# Patient Record
Sex: Male | Born: 1955 | Race: White | Hispanic: No | Marital: Married | Smoking: Former smoker
Health system: Southern US, Community
[De-identification: ages and names within clinical notes are randomized; demographics above are authoritative.]

## PROBLEM LIST (undated history)

## (undated) DIAGNOSIS — I2510 Atherosclerotic heart disease of native coronary artery without angina pectoris: Secondary | ICD-10-CM

## (undated) DIAGNOSIS — I10 Essential (primary) hypertension: Secondary | ICD-10-CM

## (undated) HISTORY — PX: CARDIAC SURGERY: SHX584

---

## 2018-08-09 ENCOUNTER — Other Ambulatory Visit: Payer: Self-pay

## 2018-08-09 ENCOUNTER — Emergency Department
Admission: EM | Admit: 2018-08-09 | Discharge: 2018-08-09 | Disposition: A | Discharge status: Home / Self Care | Payer: BC Managed Care – PPO | Attending: Emergency Medicine | Admitting: Emergency Medicine

## 2018-08-09 ENCOUNTER — Emergency Department: Payer: BC Managed Care – PPO

## 2018-08-09 DIAGNOSIS — Y93E9 Activity, other interior property and clothing maintenance: Secondary | ICD-10-CM | POA: Insufficient documentation

## 2018-08-09 DIAGNOSIS — Z87891 Personal history of nicotine dependence: Secondary | ICD-10-CM | POA: Insufficient documentation

## 2018-08-09 DIAGNOSIS — I2510 Atherosclerotic heart disease of native coronary artery without angina pectoris: Secondary | ICD-10-CM | POA: Diagnosis not present

## 2018-08-09 DIAGNOSIS — Y999 Unspecified external cause status: Secondary | ICD-10-CM | POA: Insufficient documentation

## 2018-08-09 DIAGNOSIS — W1789XA Other fall from one level to another, initial encounter: Secondary | ICD-10-CM | POA: Insufficient documentation

## 2018-08-09 DIAGNOSIS — S3992XA Unspecified injury of lower back, initial encounter: Secondary | ICD-10-CM | POA: Diagnosis present

## 2018-08-09 DIAGNOSIS — Z79899 Other long term (current) drug therapy: Secondary | ICD-10-CM | POA: Diagnosis not present

## 2018-08-09 DIAGNOSIS — Y929 Unspecified place or not applicable: Secondary | ICD-10-CM | POA: Insufficient documentation

## 2018-08-09 DIAGNOSIS — S300XXA Contusion of lower back and pelvis, initial encounter: Secondary | ICD-10-CM | POA: Diagnosis not present

## 2018-08-09 DIAGNOSIS — I10 Essential (primary) hypertension: Secondary | ICD-10-CM | POA: Insufficient documentation

## 2018-08-09 HISTORY — DX: Essential (primary) hypertension: I10

## 2018-08-09 HISTORY — DX: Atherosclerotic heart disease of native coronary artery without angina pectoris: I25.10

## 2018-08-09 NOTE — ED Triage Notes (Signed)
Pt states today he was hanging some curtains and slipped off the stool fall back onto his tail bone. Pt c/o pain and bruising to the area

## 2018-08-09 NOTE — ED Provider Notes (Signed)
[facility] Emergency Department Provider Note ____________________________________________  Time seen: Approximately 11:54 AM  I have reviewed the triage vital signs and the nursing notes.   HISTORY  Chief Complaint Fall    HPI Caleb Carr is a 63 y.o. male who presents to the emergency department for evaluation and treatment of right side back pain.  While hanging some curtains this morning, he started stepping backwards off of the footstool and missed the step.  He landed on a metal mattress casing.  Since that time, he has had a large swollen area to right lower back.  No alleviating measures attempted prior to arrival. Past Medical History:  Diagnosis Date  . Coronary artery disease   . Hypertension     There are no active problems to display for this patient.   Prior to Admission medications   Medication Sig Start Date End Date Taking? Authorizing Provider  chlorthalidone (HYGROTON) 25 MG tablet Take 25 mg by mouth daily.   Yes [provider]  irbesartan (AVAPRO) 150 MG tablet Take 150 mg by mouth daily.   Yes [provider]  VERAPAMIL HCL PO Take 240 mg by mouth.   Yes [provider]    Allergies Sulfa antibiotics  No family history on file.  Social History Social History   Tobacco Use  . Smoking status: Former Games developermoker  . Smokeless tobacco: Never Used  Substance Use Topics  . Alcohol use: Yes  . Drug use: Not Currently    Review of Systems Constitutional: Negative for fever. Cardiovascular: Negative for chest pain. Respiratory: Negative for shortness of breath. Musculoskeletal: Positive for right lower back pain Skin: Positive for swelling in the right lower back Neurological: Negative for decrease in sensation  ____________________________________________   PHYSICAL EXAM:  VITAL SIGNS: ED Triage Vitals  Enc Vitals Group     BP 08/09/18 1026 (!) 143/89     Pulse Rate 08/09/18 1026 71     Resp  08/09/18 1026 16     Temp 08/09/18 1028 98.1 F (36.7 C)     Temp Source 08/09/18 1026 Oral     SpO2 08/09/18 1026 98 %     Weight 08/09/18 1023 190 lb (86.2 kg)     Height 08/09/18 1023 5\' 8"  (1.727 m)     Head Circumference --      Peak Flow --      Pain Score 08/09/18 1023 8     Pain Loc --      Pain Edu? --      Excl. in GC? --     Constitutional: Alert and oriented. Well appearing and in no acute distress. Eyes: Conjunctivae are clear without discharge or drainage Head: Atraumatic Neck: Supple Respiratory: No cough. Respirations are even and unlabored. Musculoskeletal: No focal bony tenderness over the lumbar spine.  Swelling prevents palpation over the sacrum.  Swelling does not extend to the coccyx area. Neurologic: Ambulating without assistance.  Awake, alert, oriented x4. Skin: Firm, edematous area over the right side sacrum without open wounds, contusions, or ecchymosis Psychiatric: Affect and behavior are appropriate.  ____________________________________________   LABS (all labs ordered are listed, but only abnormal results are displayed)  Labs Reviewed - No data to display ____________________________________________  RADIOLOGY  Image of the sacrum and coccyx shows no acute bony abnormality per radiology. ____________________________________________   PROCEDURES  Procedures  ____________________________________________   INITIAL IMPRESSION / ASSESSMENT AND PLAN / ED COURSE  Caleb RaringKevin Prowell is a 63 y.o. who presents  to the emergency department for treatment and evaluation after mechanical, non-syncopal fall where he struck his back on the metal frame of the bed.  He states that he applied some ice to the area, but the area kept swelling and has significant other was worried about a "blood clot."  X-ray of the sacrum and coccyx is reassuring.  No fractures noted.  Patient is ambulatory in the room and is able to bend and turn without complaint of significant  pain.  He will be discharged home and advised to continue with the ice and ibuprofen.  Patient states that he is visiting this area for the next 3 weeks.  He was advised to return to the emergency department for symptoms of concern otherwise, he is to follow-up with his primary care provider once he returns home.  Medications - No data to display  Pertinent labs & imaging results that were available during my care of the patient were reviewed by me and considered in my medical decision making (see chart for details).  _________________________________________   FINAL CLINICAL IMPRESSION(S) / ED DIAGNOSES  Final diagnoses:  Traumatic hematoma of lower back, initial encounter    ED Discharge Orders    None       If controlled substance prescribed during this visit, 12 month history viewed on the [place] prior to issuing an initial prescription for Schedule II or III opiod.   Victorino Dike, FNP 08/09/18 1535    Schuyler Amor, MD 08/10/18 [phone]

## 2018-10-03 ENCOUNTER — Emergency Department: Payer: BC Managed Care – PPO

## 2018-10-03 ENCOUNTER — Emergency Department
Admission: EM | Admit: 2018-10-03 | Discharge: 2018-10-03 | Disposition: A | Discharge status: Home / Self Care | Payer: BC Managed Care – PPO | Attending: Emergency Medicine | Admitting: Emergency Medicine

## 2018-10-03 ENCOUNTER — Other Ambulatory Visit: Payer: Self-pay

## 2018-10-03 DIAGNOSIS — Y929 Unspecified place or not applicable: Secondary | ICD-10-CM | POA: Insufficient documentation

## 2018-10-03 DIAGNOSIS — I10 Essential (primary) hypertension: Secondary | ICD-10-CM | POA: Diagnosis not present

## 2018-10-03 DIAGNOSIS — W1839XA Other fall on same level, initial encounter: Secondary | ICD-10-CM | POA: Insufficient documentation

## 2018-10-03 DIAGNOSIS — Y999 Unspecified external cause status: Secondary | ICD-10-CM | POA: Insufficient documentation

## 2018-10-03 DIAGNOSIS — Z87891 Personal history of nicotine dependence: Secondary | ICD-10-CM | POA: Diagnosis not present

## 2018-10-03 DIAGNOSIS — Y9301 Activity, walking, marching and hiking: Secondary | ICD-10-CM | POA: Diagnosis not present

## 2018-10-03 DIAGNOSIS — I2510 Atherosclerotic heart disease of native coronary artery without angina pectoris: Secondary | ICD-10-CM | POA: Insufficient documentation

## 2018-10-03 DIAGNOSIS — R1032 Left lower quadrant pain: Secondary | ICD-10-CM | POA: Diagnosis not present

## 2018-10-03 DIAGNOSIS — Z79899 Other long term (current) drug therapy: Secondary | ICD-10-CM | POA: Insufficient documentation

## 2018-10-03 DIAGNOSIS — W19XXXA Unspecified fall, initial encounter: Secondary | ICD-10-CM

## 2018-10-03 DIAGNOSIS — M25562 Pain in left knee: Secondary | ICD-10-CM | POA: Insufficient documentation

## 2018-10-03 MED ORDER — MELOXICAM 15 MG PO TABS: 15.0000 mg | ORAL_TABLET | Freq: Every day | ORAL | 1 refills | Status: AC

## 2018-10-03 NOTE — ED Triage Notes (Signed)
C/o fall while walking dog yesterday, left knee, left groin and right hand pain.

## 2018-10-03 NOTE — ED Provider Notes (Signed)
[facility] Emergency Department Provider Note  ____________________________________________  Time seen: Approximately 3:57 PM  I have reviewed the triage vital signs and the nursing notes.   HISTORY  Chief Complaint Fall    HPI Rochele RaringKevin Brindley is a 63 y.o. male with a history of hypertension, presents to the emergency department with left knee pain and left groin pain after a fall.  Patient states that he was walking his dog when she tugged at her leash unexpectedly.  Patient states that it forced him forward and he hit the ground with his right hand in order to keep his balance.  He did not hit his head or neck during injury.  Patient states that during the fall, it caused him to twist and he has felt discomfort in his left knee and left groin since injury occurred.  He has been able to ambulate without difficulty.  Pain is worsened with internal and external rotation at the left hip.  He localizes his left knee pain to the medial aspect of the left knee in the distribution of the MCL.  He denies vertigo, blurry vision, headache, neck pain, numbness or tingling in the upper and lower extremities, chest pain, chest tightness, nausea, vomiting or abdominal pain.  Patient states that he practices a healthy and active lifestyle and works outdoors daily.        Past Medical History:  Diagnosis Date  . Coronary artery disease   . Hypertension     There are no active problems to display for this patient.   Past Surgical History:  Procedure Laterality Date  . CARDIAC SURGERY      Prior to Admission medications   Medication Sig Start Date End Date Taking? Authorizing Provider  chlorthalidone (HYGROTON) 25 MG tablet Take 25 mg by mouth daily.    [provider]  irbesartan (AVAPRO) 150 MG tablet Take 150 mg by mouth daily.    [provider]  meloxicam (MOBIC) 15 MG tablet Take 1 tablet (15 mg total) by mouth daily for 7 days. 10/03/18 10/10/18   Orvil FeilWoods, Yue Flanigan M, PA-C  VERAPAMIL HCL PO Take 240 mg by mouth.    [provider]    Allergies Sulfa antibiotics  No family history on file.  Social History Social History   Tobacco Use  . Smoking status: Former Games developermoker  . Smokeless tobacco: Never Used  Substance Use Topics  . Alcohol use: Yes  . Drug use: Not Currently     Review of Systems  Constitutional: No fever/chills Eyes: No visual changes. No discharge ENT: No upper respiratory complaints. Cardiovascular: no chest pain. Respiratory: no cough. No SOB. Gastrointestinal: No abdominal pain.  No nausea, no vomiting.  No diarrhea.  No constipation. Musculoskeletal: Patient has left knee and left groin pain.  Skin: Negative for rash, abrasions, lacerations, ecchymosis. Neurological: Negative for headaches, focal weakness or numbness.   ____________________________________________   PHYSICAL EXAM:  VITAL SIGNS: ED Triage Vitals  Enc Vitals Group     BP 10/03/18 1445 135/80     Pulse Rate 10/03/18 1445 (!) 45     Resp --      Temp 10/03/18 1445 98.3 F (36.8 C)     Temp Source 10/03/18 1445 Oral     SpO2 10/03/18 1445 97 %     Weight 10/03/18 1445 190 lb (86.2 kg)     Height 10/03/18 1445 5\' 8"  (1.727 m)     Head Circumference --      Peak  Flow --      Pain Score 10/03/18 1453 6     Pain Loc --      Pain Edu? --      Excl. in GC? --      Constitutional: Alert and oriented. Well appearing and in no acute distress. Eyes: Conjunctivae are normal. PERRL. EOMI. Head: Atraumatic. ENT:      Nose: No congestion/rhinnorhea.      Mouth/Throat: Mucous membranes are moist.  Neck: No stridor.  Full range of motion.  No midline or paraspinal muscle tenderness along the cervical spine.  Cardiovascular: Bradycardic, regular rhythm. Normal S1 and S2.  Good peripheral circulation. Respiratory: Normal respiratory effort without tachypnea or retractions. Lungs CTAB. Good air entry to the bases with no decreased  or absent breath sounds. Gastrointestinal: Bowel sounds 4 quadrants. Soft and nontender to palpation. No guarding or rigidity. No palpable masses. No distention. No CVA tenderness. Musculoskeletal: Patient has 5 out of 5 strength in the upper and lower extremities.  He has tenderness to palpation along the mid distal aspect of the left MCL.  He also has pain to palpation over the medial joint line.  No other deficits are noted with provocative testing of the left knee.  Patient does have significant pain with internal and external rotation of the left hip. Neurologic:  Normal speech and language. No gross focal neurologic deficits are appreciated.  Skin: Patient has abrasions along the dorsal aspect of the left hand. Psychiatric: Mood and affect are normal. Speech and behavior are normal. Patient exhibits appropriate insight and judgement.   ____________________________________________   LABS (all labs ordered are listed, but only abnormal results are displayed)  Labs Reviewed - No data to display ____________________________________________  EKG   ____________________________________________  RADIOLOGY I personally viewed and evaluated these images as part of my medical decision making, as well as reviewing the written report by the radiologist.    Dg Knee Complete 4 Views Left  Result Date: 10/03/2018 CLINICAL DATA:  Larey SeatFell today.  Left knee pain. EXAM: LEFT KNEE - COMPLETE 4+ VIEW COMPARISON:  None. FINDINGS: The joint spaces are maintained. No acute bony findings, osteochondral lesion or significant degenerative changes. No joint effusion. No chondrocalcinosis. Arterial calcifications are noted. IMPRESSION: No acute bony findings or joint effusion. Electronically Signed   By: Rudie MeyerP.  Gallerani M.D.   On: 10/03/2018 16:28   Dg Hip Unilat W Or Wo Pelvis 2-3 Views Left  Result Date: 10/03/2018 CLINICAL DATA:  Larey SeatFell today.  Left hip pain. EXAM: DG HIP (WITH OR WITHOUT PELVIS) 2-3V LEFT  COMPARISON:  Sacral films 08/09/2018 FINDINGS: Both hips are normally located. Moderate to advanced bilateral hip joint degenerative changes but no evidence of acute fracture or AVN. The pubic symphysis and SI joints are intact. No definite pelvic fractures. IMPRESSION: Bilateral hip joint degenerative changes but no acute hip or pelvic fractures. Electronically Signed   By: Rudie MeyerP.  Gallerani M.D.   On: 10/03/2018 16:27    ____________________________________________    PROCEDURES  Procedure(s) performed:    Procedures    Medications - No data to display   ____________________________________________   INITIAL IMPRESSION / ASSESSMENT AND PLAN / ED COURSE  Pertinent labs & imaging results that were available during my care of the patient were reviewed by me and considered in my medical decision making (see chart for details).  Review of the [place] CSRS was performed in accordance of the [facility] prior to dispensing any controlled drugs.  Assessment and Plan:  Fall:  64 year old male presents to the emergency department with left knee pain and left hip pain that occurred 24 hours ago after a mechanical, non-syncopal fall.  Patient is bradycardic at triage but vital signs otherwise stable.  On physical exam, patient has some tenderness to palpation of the MCL and the medial joint line of the left knee and he had pain with internal and external rotation at the left hip.  Neuro exam was reassuring and patient denied any type of headache or neck injury during fall.  Patient states that he was able to brace himself with his left hand.  Patient states that he does take a daily aspirin but no other blood thinners.  Differential diagnosis includes MCL sprain, meniscal tear, fracture...  X-ray examination of the left hip and left knee revealed no acute abnormality.  Patient was discharged with meloxicam and referred to orthopedics.  He voiced understanding and felt comfortable with these  recommendations.     ____________________________________________  FINAL CLINICAL IMPRESSION(S) / ED DIAGNOSES  Final diagnoses:  Fall, initial encounter      NEW MEDICATIONS STARTED DURING THIS VISIT:  ED Discharge Orders         Ordered    meloxicam (MOBIC) 15 MG tablet  Daily     10/03/18 1649              This chart was dictated using voice recognition software/Dragon. Despite best efforts to proofread, errors can occur which can change the meaning. Any change was purely unintentional.    Lannie Fields, PA-C 10/03/18 Gladys Damme, MD 10/04/18 1047

## 2018-10-03 NOTE — ED Notes (Signed)

## 2020-08-25 IMAGING — CR LEFT KNEE - COMPLETE 4+ VIEW
1 series · 4 of 4 positions shown · non-contrast
Comparison: None.

CLINICAL DATA: Fell today.  Left knee pain.

EXAM:
LEFT KNEE - COMPLETE 4+ VIEW

[Series 1: dg knee complete 4 views left · 0.14mm/px · 4 of 4 slices shown]
[im 1/4]
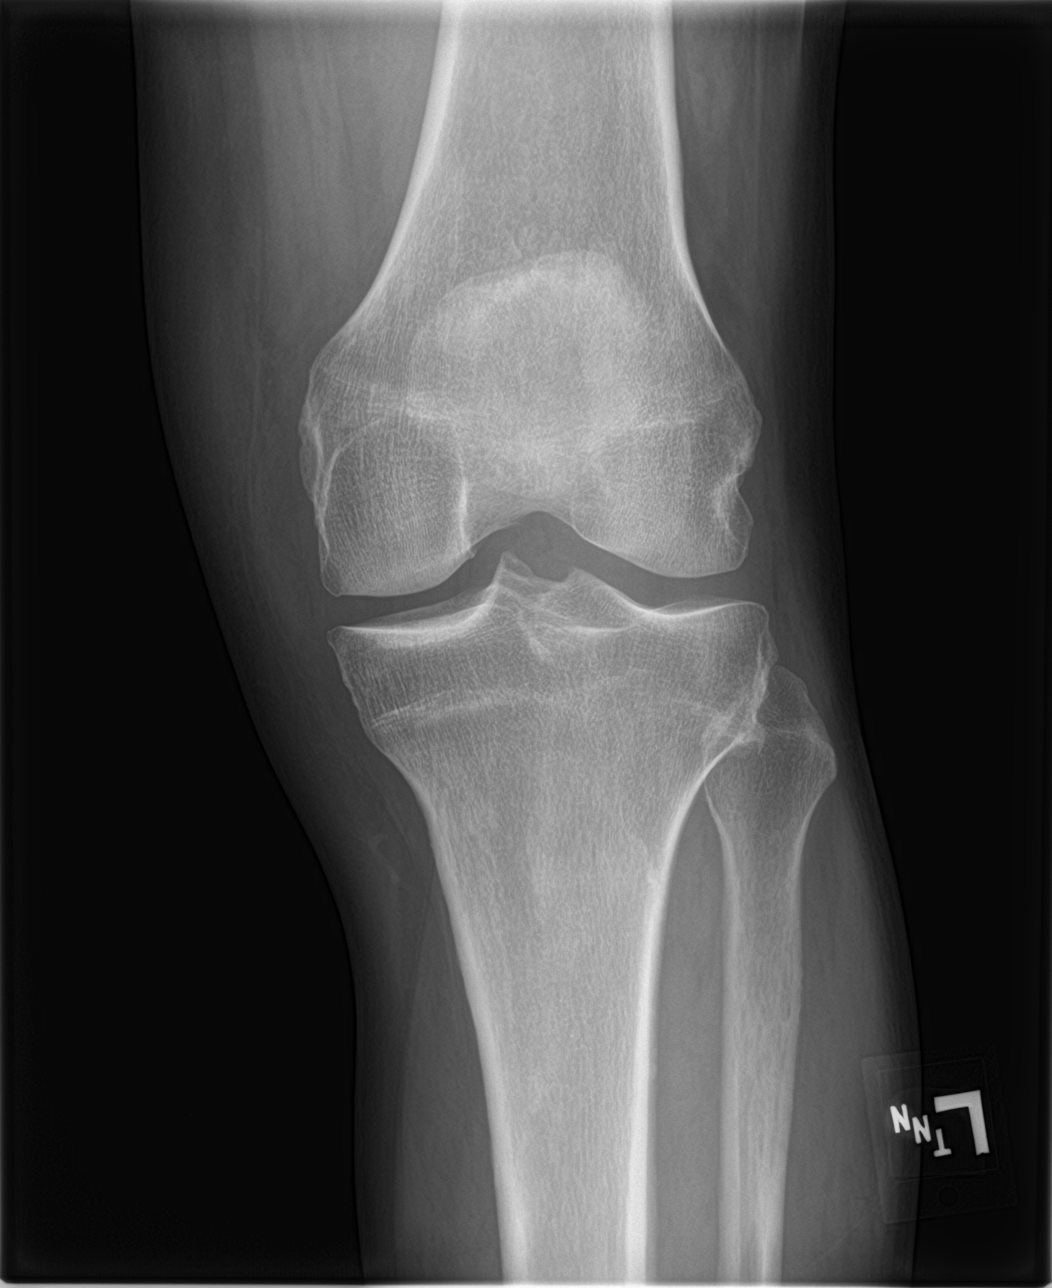
[im 2/4]
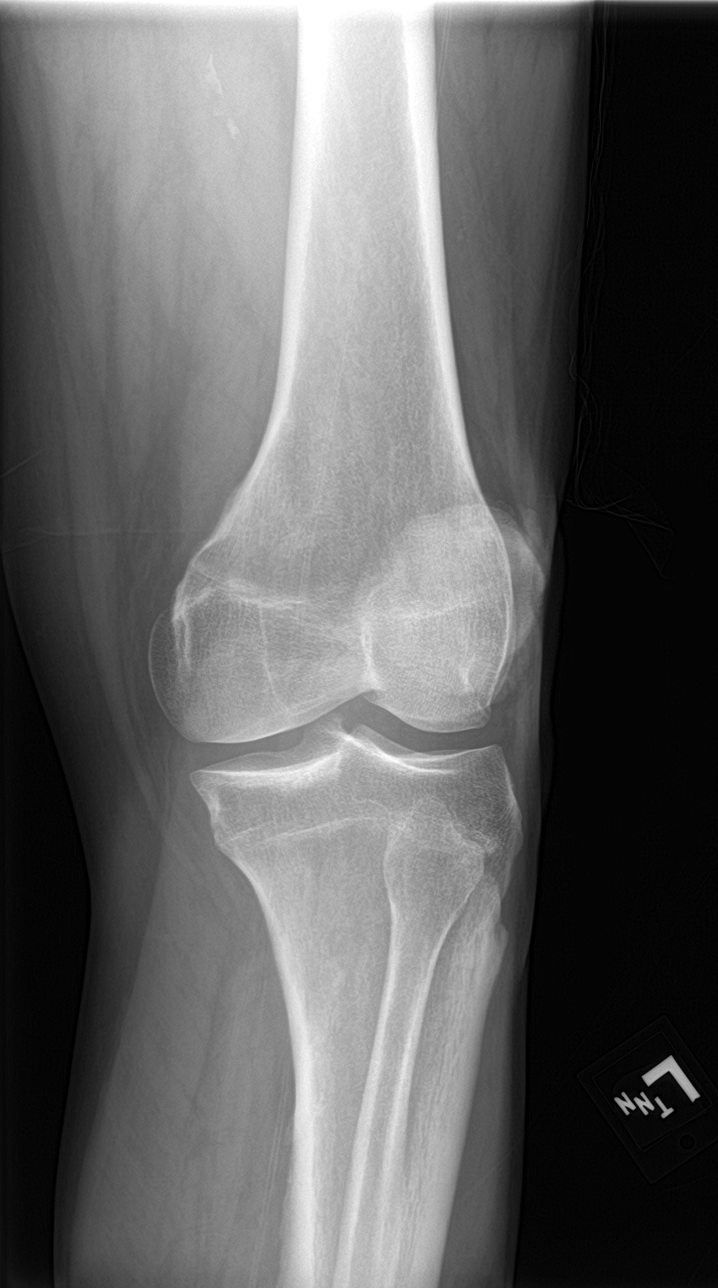
[im 3/4]
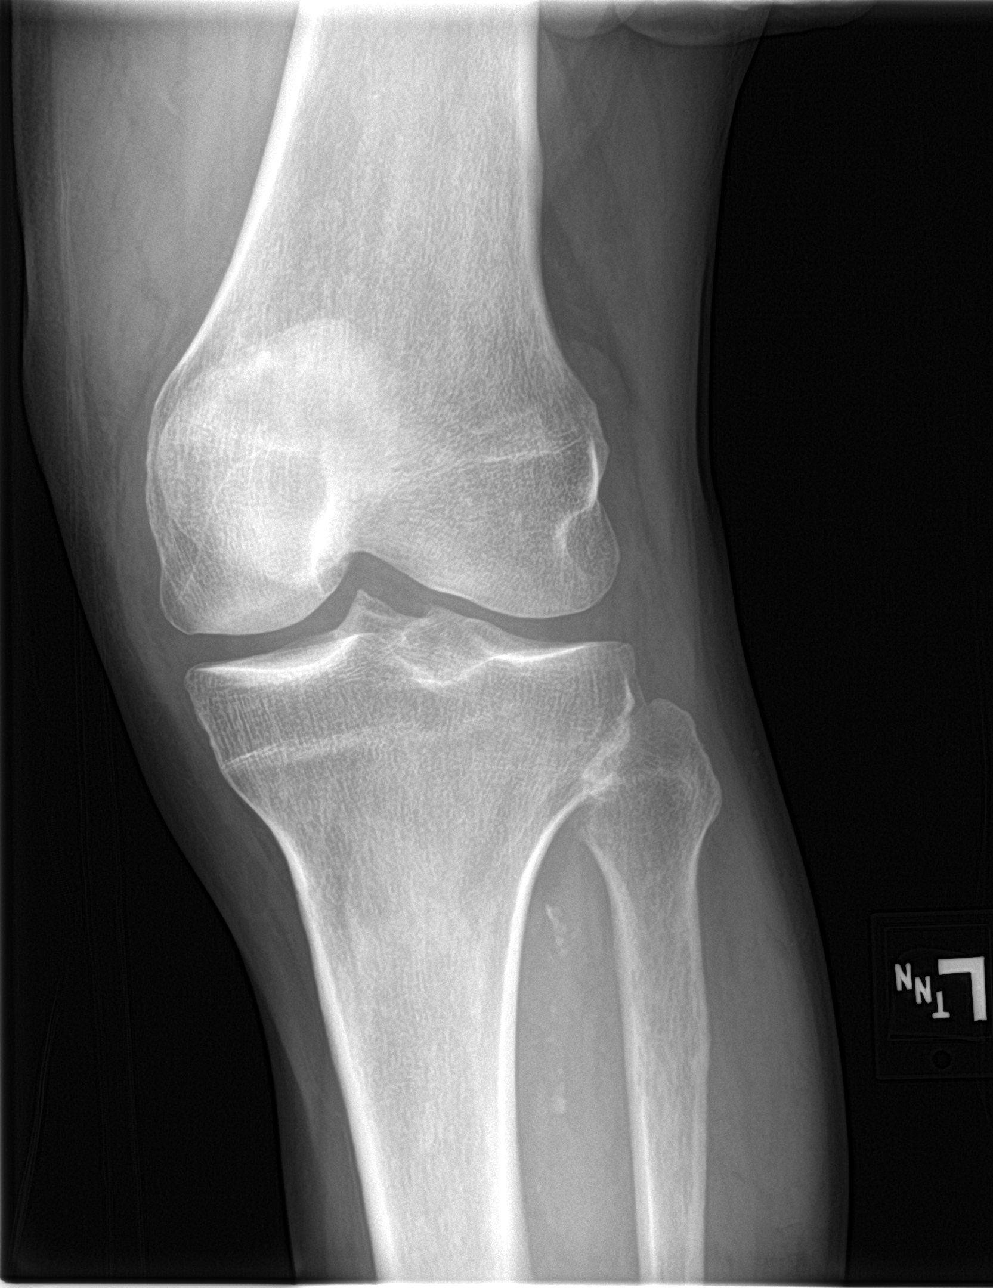
[im 4/4]
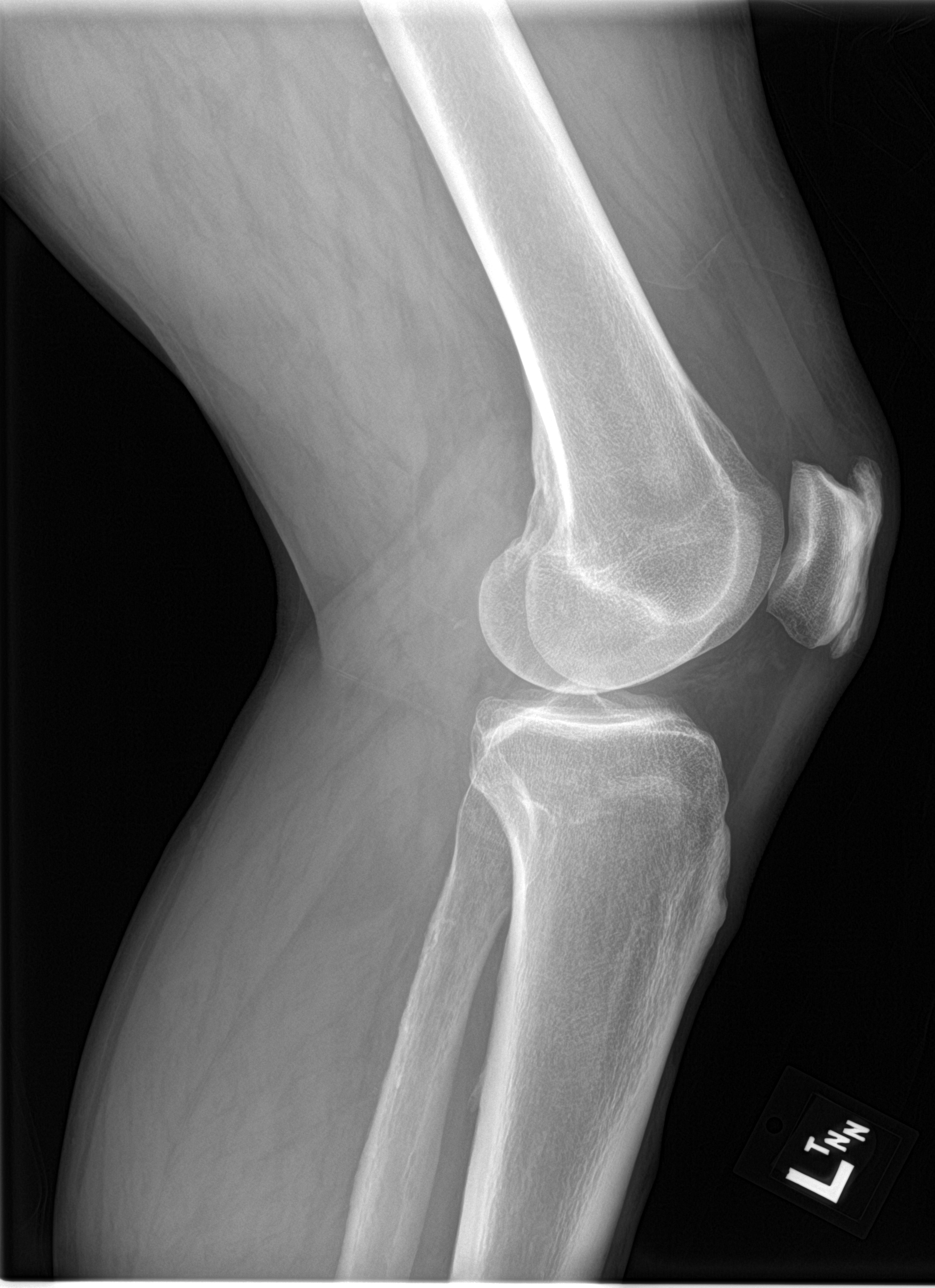

[4 of 4 positions shown; findings below may reference images not displayed]

FINDINGS: The joint spaces are maintained. No acute bony findings,
osteochondral lesion or significant degenerative changes. No joint
effusion. No chondrocalcinosis. Arterial calcifications are noted.
IMPRESSION: No acute bony findings or joint effusion.

## 2020-08-25 IMAGING — CR DG HIP (WITH OR WITHOUT PELVIS) 2-3V LEFT
1 series · 3 of 3 positions shown · non-contrast
Comparison: Sacral films 08/09/2018

CLINICAL DATA: Fell today.  Left hip pain.

EXAM:
DG HIP (WITH OR WITHOUT PELVIS) 2-3V LEFT

[Series 1: dg hip unilat w or w/o pelvis 2-3 views  · non-contrast · 0.14mm/px · 3 of 3 slices shown]
[im 1/3]
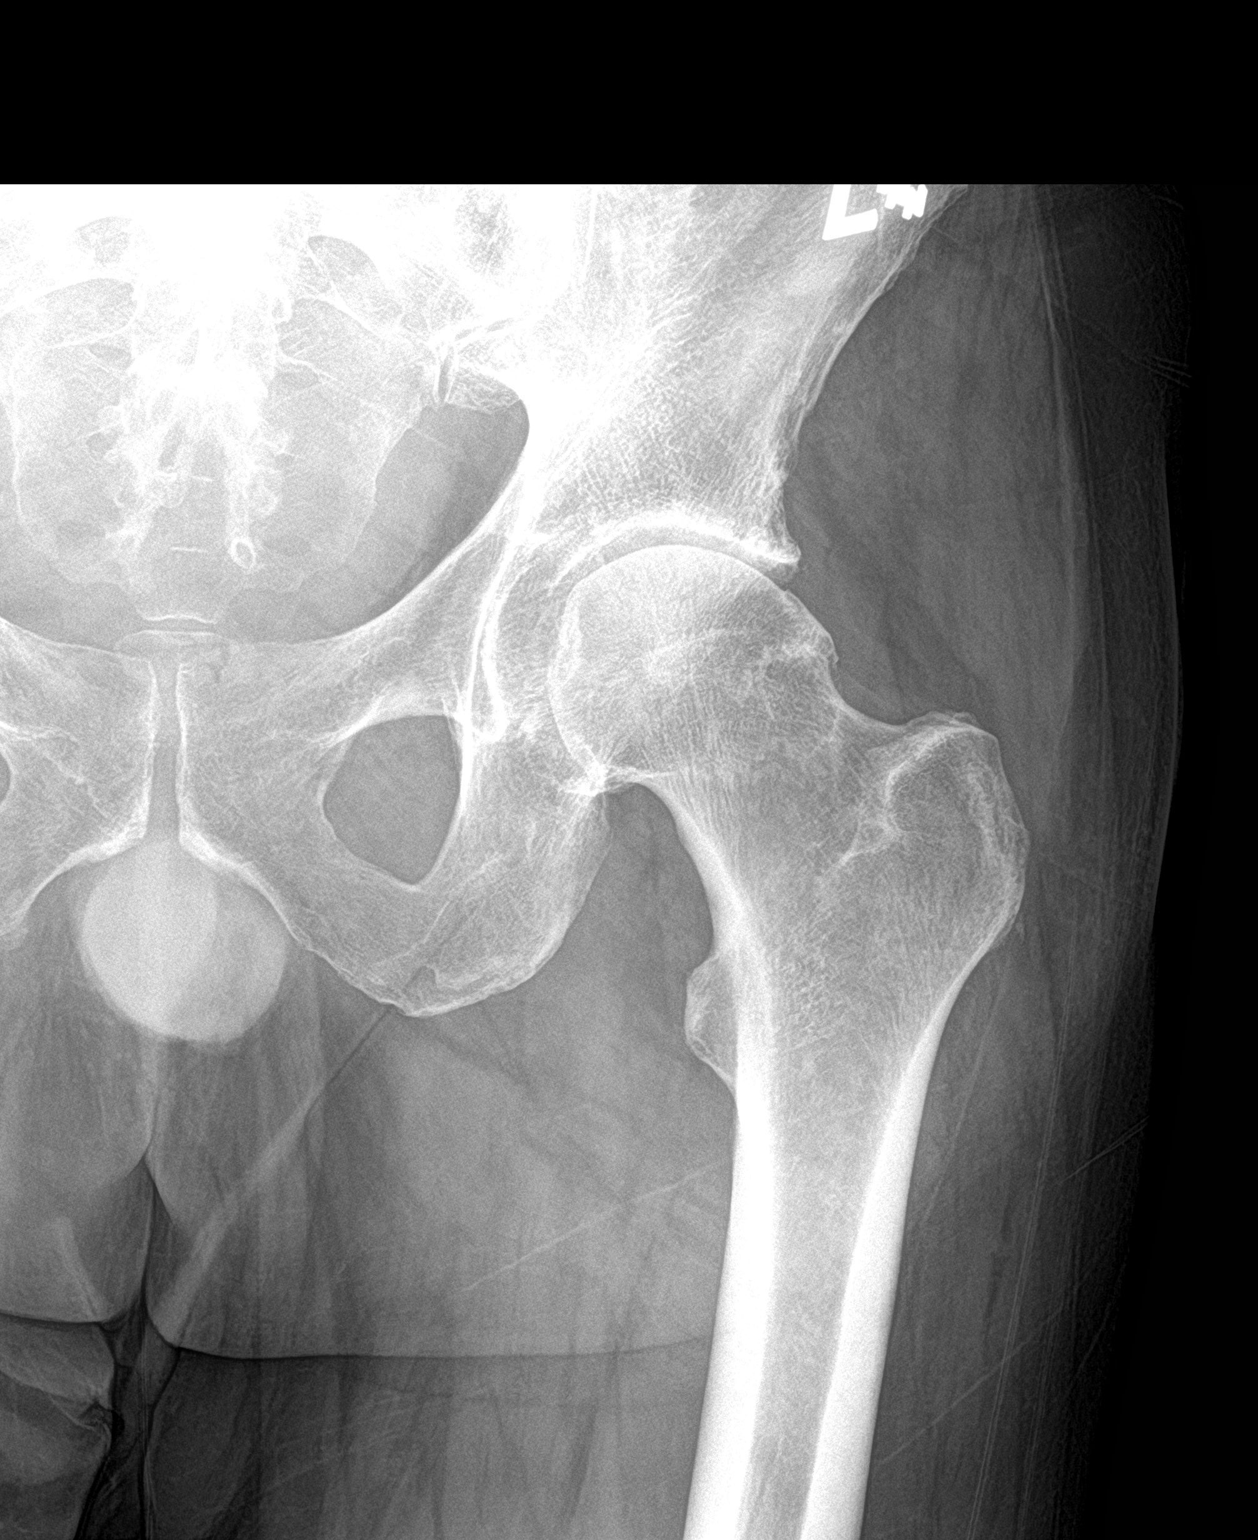
[im 2/3]
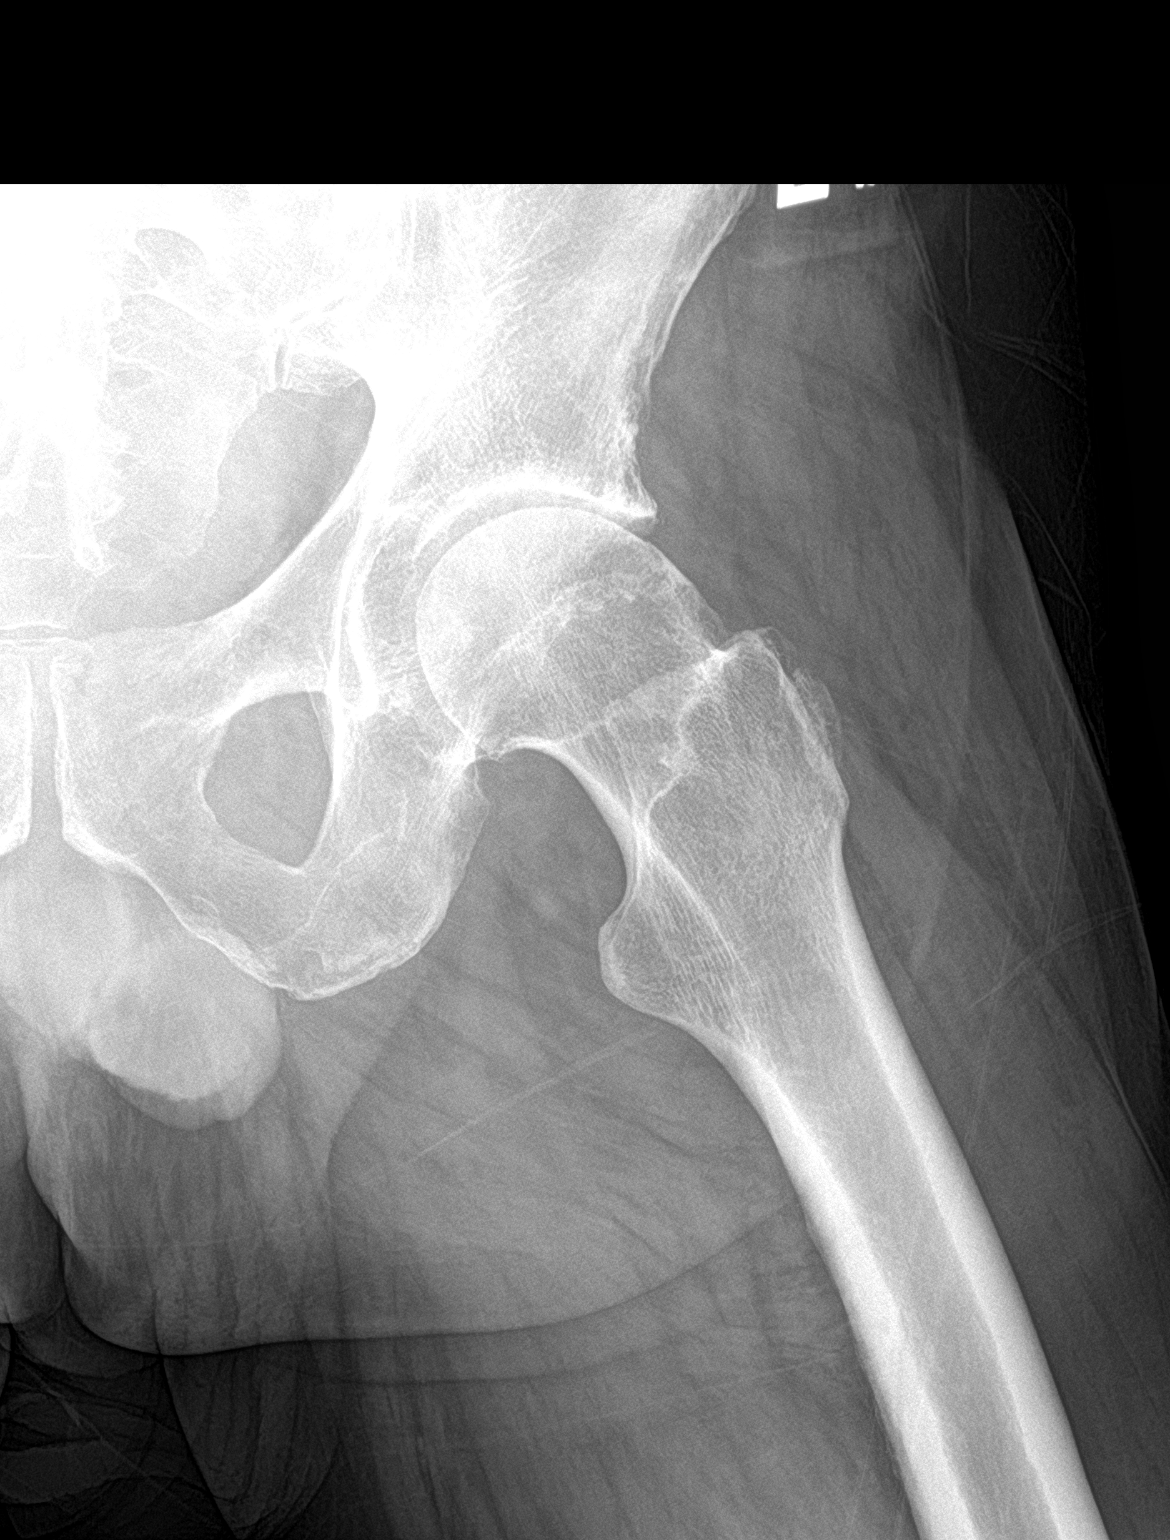
[im 3/3]
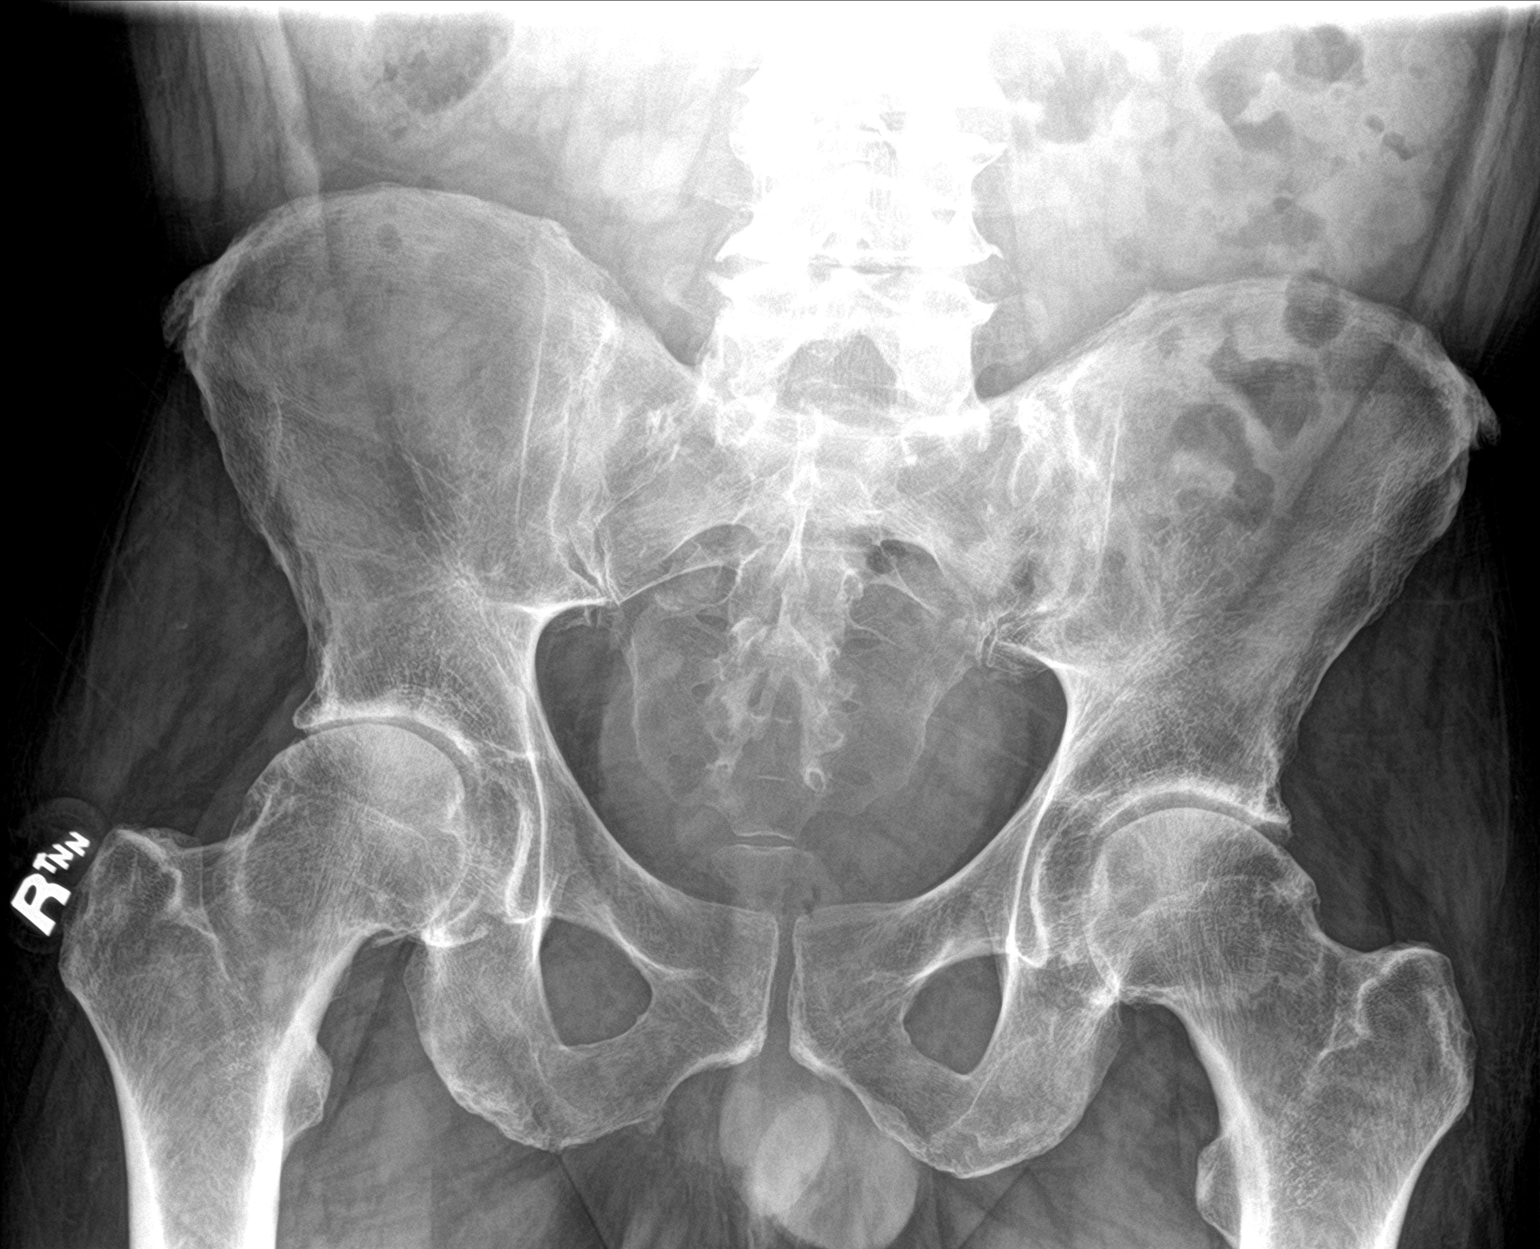

[3 of 3 positions shown; findings below may reference images not displayed]

FINDINGS: Both hips are normally located. Moderate to advanced bilateral hip
joint degenerative changes but no evidence of acute fracture or AVN.
The pubic symphysis and SI joints are intact. No definite pelvic
fractures.
IMPRESSION: Bilateral hip joint degenerative changes but no acute hip or pelvic
fractures.
